# Patient Record
Sex: Male | Born: 1960 | Race: Black or African American | Hispanic: No | Marital: Married | Smoking: Never smoker
Health system: Southern US, Community
[De-identification: ages and names within clinical notes are randomized; demographics above are authoritative.]

## PROBLEM LIST (undated history)

## (undated) DIAGNOSIS — S83249A Other tear of medial meniscus, current injury, unspecified knee, initial encounter: Secondary | ICD-10-CM

## (undated) DIAGNOSIS — I10 Essential (primary) hypertension: Secondary | ICD-10-CM

## (undated) HISTORY — PX: NO PAST SURGERIES: SHX2092

---

## 1998-12-16 ENCOUNTER — Emergency Department (HOSPITAL_COMMUNITY): Admission: EM | Admit: 1998-12-16 | Discharge: 1998-12-16 | Payer: Self-pay | Admitting: Emergency Medicine

## 1998-12-23 ENCOUNTER — Emergency Department (HOSPITAL_COMMUNITY): Admission: EM | Admit: 1998-12-23 | Discharge: 1998-12-23 | Payer: Self-pay | Admitting: Emergency Medicine

## 2001-06-29 ENCOUNTER — Emergency Department (HOSPITAL_COMMUNITY): Admission: EM | Admit: 2001-06-29 | Discharge: 2001-06-29 | Payer: Self-pay

## 2016-09-30 ENCOUNTER — Other Ambulatory Visit: Payer: Self-pay | Admitting: Occupational Medicine

## 2016-09-30 ENCOUNTER — Ambulatory Visit: Payer: Self-pay

## 2016-09-30 DIAGNOSIS — M25562 Pain in left knee: Secondary | ICD-10-CM

## 2016-10-30 ENCOUNTER — Encounter (HOSPITAL_BASED_OUTPATIENT_CLINIC_OR_DEPARTMENT_OTHER)
Admission: RE | Admit: 2016-10-30 | Discharge: 2016-10-30 | Disposition: A | Discharge status: Home / Self Care | Payer: BLUE CROSS/BLUE SHIELD | Source: Ambulatory Visit | Attending: Orthopedic Surgery | Admitting: Orthopedic Surgery

## 2016-10-30 ENCOUNTER — Encounter (HOSPITAL_BASED_OUTPATIENT_CLINIC_OR_DEPARTMENT_OTHER): Payer: Self-pay | Admitting: *Deleted

## 2016-10-30 DIAGNOSIS — I10 Essential (primary) hypertension: Secondary | ICD-10-CM | POA: Insufficient documentation

## 2016-10-30 DIAGNOSIS — M94262 Chondromalacia, left knee: Secondary | ICD-10-CM | POA: Diagnosis not present

## 2016-10-30 DIAGNOSIS — S83282A Other tear of lateral meniscus, current injury, left knee, initial encounter: Secondary | ICD-10-CM | POA: Diagnosis not present

## 2016-10-30 DIAGNOSIS — S83242A Other tear of medial meniscus, current injury, left knee, initial encounter: Secondary | ICD-10-CM | POA: Diagnosis not present

## 2016-10-31 ENCOUNTER — Ambulatory Visit: Payer: Self-pay | Admitting: Physician Assistant

## 2016-10-31 NOTE — H&P (Signed)
Roberto BillJulian R Torres is an 56 y.o. male.   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  He initially led with his uninvolved right knee, but then he twisted and felt a pop in his left knee.  On 09/27/2016 he went to an urgent care setting where x-rays were basically unremarkable.  They showed some soft tissue swelling, slight arthritic changes and spurring along the patella.  He has severe pain.  He went in to do some light duty and was sent home after several days.  MRI confirms torn medial meniscus.  Past Medical History:  Diagnosis Date  . Hypertension   . MMT (medial meniscus tear)    left knee    Past Surgical History:  Procedure Laterality Date  . NO PAST SURGERIES      No family history on file. Social History:  reports that he has never smoked. He has never used smokeless tobacco. He reports that he drinks alcohol. He reports that he does not use drugs.  Allergies: No Known Allergies   (Not in a hospital admission)  No results found for this or any previous visit (from the past 48 hour(s)). No results found.  Review of Systems  Musculoskeletal: Positive for joint pain.  All other systems reviewed and are negative.   There were no vitals taken for this visit. Physical Exam  Constitutional: He is oriented to person, place, and time. He appears well-developed and well-nourished. No distress.  HENT:  Head: Normocephalic and atraumatic.  Eyes: Conjunctivae and EOM are normal. Pupils are equal, round, and reactive to light.  Neck: Normal range of motion.  Cardiovascular: Normal rate and intact distal pulses.   Respiratory: Effort normal. No respiratory distress.  GI: Soft. There is no tenderness.  Musculoskeletal:       Left knee: He exhibits swelling. Tenderness found. Medial joint line tenderness noted.  Neurological: He is alert and oriented to person, place, and time.  Skin: Skin is warm  and dry.  Psychiatric: He has a normal mood and affect.     Assessment/Plan Left knee medial meniscus tear chondromalacia    I recommend arthroscopic evaluation, partial medial meniscectomy of the left knee with debridement and chondroplasty.  He will need post-op rehab to be pre-authorized.  I would like to do the surgery on an outpatient basis as soon as practical since he is out of work.  We will have him start therapy the week after.  Risks and benefits are discussed in detail.  We will go ahead and give him Percocet.    Margart SicklesChadwell, Naydeen Speirs, PA-C 10/31/2016, 12:59 PM

## 2016-11-01 ENCOUNTER — Ambulatory Visit (HOSPITAL_BASED_OUTPATIENT_CLINIC_OR_DEPARTMENT_OTHER): Payer: Worker's Compensation | Admitting: Anesthesiology

## 2016-11-01 ENCOUNTER — Encounter (HOSPITAL_BASED_OUTPATIENT_CLINIC_OR_DEPARTMENT_OTHER): Payer: Self-pay

## 2016-11-01 ENCOUNTER — Ambulatory Visit (HOSPITAL_BASED_OUTPATIENT_CLINIC_OR_DEPARTMENT_OTHER)
Admission: RE | Admit: 2016-11-01 | Discharge: 2016-11-01 | Disposition: A | Discharge status: Home / Self Care | Payer: Worker's Compensation | Source: Ambulatory Visit | Attending: Orthopedic Surgery | Admitting: Orthopedic Surgery

## 2016-11-01 ENCOUNTER — Encounter (HOSPITAL_BASED_OUTPATIENT_CLINIC_OR_DEPARTMENT_OTHER)
Admission: RE | Disposition: A | Discharge status: Home / Self Care | Payer: Self-pay | Source: Ambulatory Visit | Attending: Orthopedic Surgery

## 2016-11-01 DIAGNOSIS — S83282A Other tear of lateral meniscus, current injury, left knee, initial encounter: Secondary | ICD-10-CM | POA: Diagnosis not present

## 2016-11-01 DIAGNOSIS — E669 Obesity, unspecified: Secondary | ICD-10-CM | POA: Diagnosis not present

## 2016-11-01 DIAGNOSIS — Y9301 Activity, walking, marching and hiking: Secondary | ICD-10-CM | POA: Insufficient documentation

## 2016-11-01 DIAGNOSIS — W1809XA Striking against other object with subsequent fall, initial encounter: Secondary | ICD-10-CM | POA: Insufficient documentation

## 2016-11-01 DIAGNOSIS — S83242A Other tear of medial meniscus, current injury, left knee, initial encounter: Secondary | ICD-10-CM | POA: Diagnosis present

## 2016-11-01 DIAGNOSIS — Y990 Civilian activity done for income or pay: Secondary | ICD-10-CM | POA: Insufficient documentation

## 2016-11-01 DIAGNOSIS — Z6833 Body mass index (BMI) 33.0-33.9, adult: Secondary | ICD-10-CM | POA: Diagnosis not present

## 2016-11-01 DIAGNOSIS — M94262 Chondromalacia, left knee: Secondary | ICD-10-CM | POA: Insufficient documentation

## 2016-11-01 DIAGNOSIS — I10 Essential (primary) hypertension: Secondary | ICD-10-CM | POA: Diagnosis not present

## 2016-11-01 HISTORY — DX: Essential (primary) hypertension: I10

## 2016-11-01 HISTORY — PX: KNEE ARTHROSCOPY WITH MEDIAL MENISECTOMY: SHX5651

## 2016-11-01 HISTORY — PX: KNEE ARTHROSCOPY WITH LATERAL MENISECTOMY: SHX6193

## 2016-11-01 HISTORY — PX: KNEE ARTHROSCOPY WITH EXCISION PLICA: SHX5647

## 2016-11-01 HISTORY — DX: Other tear of medial meniscus, current injury, unspecified knee, initial encounter: S83.249A

## 2016-11-01 SURGERY — ARTHROSCOPY, KNEE, WITH MEDIAL MENISCECTOMY
Anesthesia: General | Site: Knee | Laterality: Left

## 2016-11-01 MED ORDER — KETOROLAC TROMETHAMINE 30 MG/ML IJ SOLN
INTRAMUSCULAR | Status: DC | PRN
  Administered 2016-11-01: 30 mg via INTRAVENOUS

## 2016-11-01 MED ORDER — OXYCODONE-ACETAMINOPHEN 5-325 MG PO TABS: 1.0000 | ORAL_TABLET | ORAL | Status: DC | PRN

## 2016-11-01 MED ORDER — EPINEPHRINE 30 MG/30ML IJ SOLN
INTRAMUSCULAR | Status: AC
  Filled 2016-11-01: qty 1

## 2016-11-01 MED ORDER — PROMETHAZINE HCL 25 MG/ML IJ SOLN
6.2500 mg | INTRAMUSCULAR | Status: DC | PRN
  Administered 2016-11-01: 6.25 mg via INTRAVENOUS

## 2016-11-01 MED ORDER — KETOROLAC TROMETHAMINE 30 MG/ML IJ SOLN
INTRAMUSCULAR | Status: AC
  Filled 2016-11-01: qty 1

## 2016-11-01 MED ORDER — PROPOFOL 10 MG/ML IV BOLUS
INTRAVENOUS | Status: DC | PRN
  Administered 2016-11-01: 200 mg via INTRAVENOUS

## 2016-11-01 MED ORDER — MEPERIDINE HCL 25 MG/ML IJ SOLN: 6.2500 mg | INTRAMUSCULAR | Status: DC | PRN

## 2016-11-01 MED ORDER — SODIUM CHLORIDE 0.9 % IR SOLN
Status: DC | PRN
  Administered 2016-11-01: 4500 mL

## 2016-11-01 MED ORDER — SCOPOLAMINE 1 MG/3DAYS TD PT72
MEDICATED_PATCH | TRANSDERMAL | Status: AC
  Filled 2016-11-01: qty 1

## 2016-11-01 MED ORDER — MIDAZOLAM HCL 2 MG/2ML IJ SOLN
INTRAMUSCULAR | Status: AC
  Filled 2016-11-01: qty 2

## 2016-11-01 MED ORDER — FENTANYL CITRATE (PF) 100 MCG/2ML IJ SOLN
50.0000 ug | INTRAMUSCULAR | Status: DC | PRN
  Administered 2016-11-01: 100 ug via INTRAVENOUS
  Administered 2016-11-01: 50 ug via INTRAVENOUS

## 2016-11-01 MED ORDER — BUPIVACAINE-EPINEPHRINE 0.5% -1:200000 IJ SOLN
INTRAMUSCULAR | Status: DC | PRN
  Administered 2016-11-01: 20 mL

## 2016-11-01 MED ORDER — METOCLOPRAMIDE HCL 5 MG PO TABS: 5.0000 mg | ORAL_TABLET | Freq: Three times a day (TID) | ORAL | Status: DC | PRN

## 2016-11-01 MED ORDER — CHLORHEXIDINE GLUCONATE 4 % EX LIQD: 60.0000 mL | Freq: Once | CUTANEOUS | Status: DC

## 2016-11-01 MED ORDER — CEFAZOLIN SODIUM-DEXTROSE 2-4 GM/100ML-% IV SOLN
2.0000 g | INTRAVENOUS | Status: AC
  Administered 2016-11-01: 2 g via INTRAVENOUS

## 2016-11-01 MED ORDER — ONDANSETRON HCL 4 MG/2ML IJ SOLN
INTRAMUSCULAR | Status: DC | PRN
  Administered 2016-11-01: 4 mg via INTRAVENOUS

## 2016-11-01 MED ORDER — METHYLPREDNISOLONE ACETATE 80 MG/ML IJ SUSP
INTRAMUSCULAR | Status: AC
  Filled 2016-11-01: qty 1

## 2016-11-01 MED ORDER — LIDOCAINE 2% (20 MG/ML) 5 ML SYRINGE
INTRAMUSCULAR | Status: AC
Start: 2016-11-01 — End: 2016-11-01
  Filled 2016-11-01: qty 5

## 2016-11-01 MED ORDER — ONDANSETRON HCL 4 MG/2ML IJ SOLN: 4.0000 mg | Freq: Four times a day (QID) | INTRAMUSCULAR | Status: DC | PRN

## 2016-11-01 MED ORDER — MORPHINE SULFATE (PF) 4 MG/ML IV SOLN
INTRAVENOUS | Status: AC
Start: 2016-11-01 — End: 2016-11-01
  Filled 2016-11-01: qty 1

## 2016-11-01 MED ORDER — DEXAMETHASONE SODIUM PHOSPHATE 10 MG/ML IJ SOLN
INTRAMUSCULAR | Status: AC
  Filled 2016-11-01: qty 1

## 2016-11-01 MED ORDER — HYDROMORPHONE HCL 1 MG/ML IJ SOLN
INTRAMUSCULAR | Status: AC
  Filled 2016-11-01: qty 0.5

## 2016-11-01 MED ORDER — LIDOCAINE 2% (20 MG/ML) 5 ML SYRINGE
INTRAMUSCULAR | Status: DC | PRN
  Administered 2016-11-01: 40 mg via INTRAVENOUS

## 2016-11-01 MED ORDER — METOCLOPRAMIDE HCL 5 MG/ML IJ SOLN: 5.0000 mg | Freq: Three times a day (TID) | INTRAMUSCULAR | Status: DC | PRN

## 2016-11-01 MED ORDER — SODIUM CHLORIDE 0.9 % IV SOLN: INTRAVENOUS | Status: DC

## 2016-11-01 MED ORDER — HYDROMORPHONE HCL 1 MG/ML IJ SOLN
0.2500 mg | INTRAMUSCULAR | Status: DC | PRN
  Administered 2016-11-01: 0.5 mg via INTRAVENOUS

## 2016-11-01 MED ORDER — HYDROCODONE-ACETAMINOPHEN 7.5-325 MG PO TABS: 1.0000 | ORAL_TABLET | Freq: Once | ORAL | Status: DC | PRN

## 2016-11-01 MED ORDER — BUPIVACAINE-EPINEPHRINE (PF) 0.5% -1:200000 IJ SOLN
INTRAMUSCULAR | Status: AC
  Filled 2016-11-01: qty 30

## 2016-11-01 MED ORDER — METHYLPREDNISOLONE ACETATE 40 MG/ML IJ SUSP
INTRAMUSCULAR | Status: AC
  Filled 2016-11-01: qty 1

## 2016-11-01 MED ORDER — CEFAZOLIN SODIUM-DEXTROSE 2-4 GM/100ML-% IV SOLN
INTRAVENOUS | Status: AC
  Filled 2016-11-01: qty 100

## 2016-11-01 MED ORDER — DEXAMETHASONE SODIUM PHOSPHATE 4 MG/ML IJ SOLN
INTRAMUSCULAR | Status: DC | PRN
  Administered 2016-11-01: 10 mg via INTRAVENOUS

## 2016-11-01 MED ORDER — METHYLPREDNISOLONE ACETATE 80 MG/ML IJ SUSP
INTRAMUSCULAR | Status: DC | PRN
  Administered 2016-11-01: 80 mg via INTRA_ARTICULAR

## 2016-11-01 MED ORDER — SCOPOLAMINE 1 MG/3DAYS TD PT72: 1.0000 | MEDICATED_PATCH | Freq: Once | TRANSDERMAL | Status: DC | PRN

## 2016-11-01 MED ORDER — ONDANSETRON HCL 4 MG PO TABS: 4.0000 mg | ORAL_TABLET | Freq: Four times a day (QID) | ORAL | Status: DC | PRN

## 2016-11-01 MED ORDER — ONDANSETRON HCL 4 MG/2ML IJ SOLN
INTRAMUSCULAR | Status: AC
  Filled 2016-11-01: qty 2

## 2016-11-01 MED ORDER — LACTATED RINGERS IV SOLN
INTRAVENOUS | Status: DC
  Administered 2016-11-01: 12:00:00 via INTRAVENOUS

## 2016-11-01 MED ORDER — FENTANYL CITRATE (PF) 100 MCG/2ML IJ SOLN
INTRAMUSCULAR | Status: AC
  Filled 2016-11-01: qty 2

## 2016-11-01 MED ORDER — PROMETHAZINE HCL 25 MG/ML IJ SOLN
INTRAMUSCULAR | Status: AC
  Filled 2016-11-01: qty 1

## 2016-11-01 MED ORDER — MORPHINE SULFATE (PF) 4 MG/ML IV SOLN
INTRAVENOUS | Status: DC | PRN
  Administered 2016-11-01: 4 mg

## 2016-11-01 MED ORDER — HYDROMORPHONE HCL 1 MG/ML IJ SOLN: 0.5000 mg | INTRAMUSCULAR | Status: DC | PRN

## 2016-11-01 MED ORDER — MIDAZOLAM HCL 2 MG/2ML IJ SOLN
1.0000 mg | INTRAMUSCULAR | Status: DC | PRN
  Administered 2016-11-01: 2 mg via INTRAVENOUS

## 2016-11-01 MED ORDER — BUPIVACAINE HCL (PF) 0.5 % IJ SOLN
INTRAMUSCULAR | Status: AC
  Filled 2016-11-01: qty 30

## 2016-11-01 SURGICAL SUPPLY — 39 items
BANDAGE ACE 6X5 VEL STRL LF (GAUZE/BANDAGES/DRESSINGS) IMPLANT
BANDAGE ESMARK 6X9 LF (GAUZE/BANDAGES/DRESSINGS) IMPLANT
BLADE 4.2CUDA (BLADE) ×3 IMPLANT
BLADE CUDA GRT WHITE 3.5 (BLADE) ×3 IMPLANT
BNDG ESMARK 6X9 LF (GAUZE/BANDAGES/DRESSINGS)
BNDG GAUZE ELAST 4 BULKY (GAUZE/BANDAGES/DRESSINGS) ×3 IMPLANT
DRAPE ARTHROSCOPY W/POUCH 90 (DRAPES) ×3 IMPLANT
DRSG EMULSION OIL 3X3 NADH (GAUZE/BANDAGES/DRESSINGS) ×3 IMPLANT
DURAPREP 26ML APPLICATOR (WOUND CARE) ×3 IMPLANT
GAUZE SPONGE 4X4 12PLY STRL (GAUZE/BANDAGES/DRESSINGS) ×3 IMPLANT
GLOVE BIO SURGEON STRL SZ7.5 (GLOVE) IMPLANT
GLOVE BIOGEL PI IND STRL 7.0 (GLOVE) ×1 IMPLANT
GLOVE BIOGEL PI IND STRL 8 (GLOVE) ×1 IMPLANT
GLOVE BIOGEL PI INDICATOR 7.0 (GLOVE) ×2
GLOVE BIOGEL PI INDICATOR 8 (GLOVE) ×2
GLOVE ECLIPSE 6.5 STRL STRAW (GLOVE) ×6 IMPLANT
GLOVE SURG ORTHO 8.0 STRL STRW (GLOVE) ×3 IMPLANT
GOWN STRL REUS W/ TWL LRG LVL3 (GOWN DISPOSABLE) ×1 IMPLANT
GOWN STRL REUS W/ TWL XL LVL3 (GOWN DISPOSABLE) IMPLANT
GOWN STRL REUS W/TWL LRG LVL3 (GOWN DISPOSABLE) ×2
GOWN STRL REUS W/TWL XL LVL3 (GOWN DISPOSABLE) ×3 IMPLANT
HOLDER KNEE FOAM BLUE (MISCELLANEOUS) ×3 IMPLANT
IV NS IRRIG 3000ML ARTHROMATIC (IV SOLUTION) ×6 IMPLANT
KNEE WRAP E Z 3 GEL PACK (MISCELLANEOUS) ×3 IMPLANT
MANIFOLD NEPTUNE II (INSTRUMENTS) ×3 IMPLANT
NDL SAFETY ECLIPSE 18X1.5 (NEEDLE) IMPLANT
NEEDLE HYPO 18GX1.5 SHARP (NEEDLE)
NEEDLE HYPO 22GX1.5 SAFETY (NEEDLE) ×3 IMPLANT
PACK ARTHROSCOPY DSU (CUSTOM PROCEDURE TRAY) ×3 IMPLANT
PACK BASIN DAY SURGERY FS (CUSTOM PROCEDURE TRAY) ×3 IMPLANT
PROBE BIPOLAR ARTHRO 85MM 30D (MISCELLANEOUS) IMPLANT
PROBE BIPOLAR ATHRO 135MM 90D (MISCELLANEOUS) IMPLANT
SET ARTHROSCOPY TUBING (MISCELLANEOUS) ×2
SET ARTHROSCOPY TUBING LN (MISCELLANEOUS) ×1 IMPLANT
SUT ETHILON 4 0 PS 2 18 (SUTURE) ×3 IMPLANT
SYR 5ML LL (SYRINGE) ×6 IMPLANT
TOWEL OR 17X24 6PK STRL BLUE (TOWEL DISPOSABLE) ×3 IMPLANT
TOWEL OR NON WOVEN STRL DISP B (DISPOSABLE) ×3 IMPLANT
WATER STERILE IRR 1000ML POUR (IV SOLUTION) ×3 IMPLANT

## 2016-11-01 NOTE — Transfer of Care (Signed)
Immediate Anesthesia Transfer of Care Note  Patient: Jennette BillJulian R Grove  Procedure(s) Performed: Procedure(s): LEFT KNEE ARTHROSCOPY WITH MEDIAL MENISECTOMY (Left) KNEE ARTHROSCOPY WITH LATERAL MENISECTOMY (Left) KNEE ARTHROSCOPY WITH EXCISION PLICA (Left)  Patient Location: PACU  Anesthesia Type:General  Level of Consciousness: awake and sedated  Airway & Oxygen Therapy: Patient Spontanous Breathing and Patient connected to face mask oxygen  Post-op Assessment: Report given to RN and Post -op Vital signs reviewed and stable  Post vital signs: Reviewed and stable  Last Vitals:  Vitals:   11/01/16 1128 11/01/16 1415  BP: 127/87   Pulse: 84 88  Resp: 18 15  Temp: 36.9 C     Last Pain:  Vitals:   11/01/16 1128  TempSrc: Oral  PainSc: 9       Patients Stated Pain Goal: 3 (11/01/16 1128)  Complications: No apparent anesthesia complications

## 2016-11-01 NOTE — Interval H&P Note (Signed)
History and Physical Interval Note:  11/01/2016 1:00 PM  Roberto BillJulian R Zirbel  has presented today for surgery, with the diagnosis of LEFT KNEE MEDIAL MINSCUS TEAR  The various methods of treatment have been discussed with the patient and family. After consideration of risks, benefits and other options for treatment, the patient has consented to  Procedure(s): LEFT KNEE ARTHROSCOPY WITH MEDIAL MENISECTOMY (Left) as a surgical intervention .  The patient's history has been reviewed, patient examined, no change in status, stable for surgery.  I have reviewed the patient's chart and labs.  Questions were answered to the patient's satisfaction.     Tanuj Mullens JR,W D

## 2016-11-01 NOTE — Anesthesia Procedure Notes (Signed)
Procedure Name: LMA Insertion Performed by: Zairah Arista W Pre-anesthesia Checklist: Patient identified, Emergency Drugs available, Suction available and Patient being monitored Patient Re-evaluated:Patient Re-evaluated prior to inductionOxygen Delivery Method: Circle system utilized Preoxygenation: Pre-oxygenation with 100% oxygen Intubation Type: IV induction Ventilation: Mask ventilation without difficulty LMA: LMA inserted LMA Size: 5.0 Number of attempts: 1 Placement Confirmation: positive ETCO2 Tube secured with: Tape Dental Injury: Teeth and Oropharynx as per pre-operative assessment        

## 2016-11-01 NOTE — H&P (View-Only) (Signed)
Roberto Torres is an 56 y.o. male.   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  He initially led with his uninvolved right knee, but then he twisted and felt a pop in his left knee.  On 09/27/2016 he went to an urgent care setting where x-rays were basically unremarkable.  They showed some soft tissue swelling, slight arthritic changes and spurring along the patella.  He has severe pain.  He went in to do some light duty and was sent home after several days.  MRI confirms torn medial meniscus.  Past Medical History:  Diagnosis Date  . Hypertension   . MMT (medial meniscus tear)    left knee    Past Surgical History:  Procedure Laterality Date  . NO PAST SURGERIES      No family history on file. Social History:  reports that he has never smoked. He has never used smokeless tobacco. He reports that he drinks alcohol. He reports that he does not use drugs.  Allergies: No Known Allergies   (Not in a hospital admission)  No results found for this or any previous visit (from the past 48 hour(s)). No results found.  Review of Systems  Musculoskeletal: Positive for joint pain.  All other systems reviewed and are negative.   There were no vitals taken for this visit. Physical Exam  Constitutional: He is oriented to person, place, and time. He appears well-developed and well-nourished. No distress.  HENT:  Head: Normocephalic and atraumatic.  Eyes: Conjunctivae and EOM are normal. Pupils are equal, round, and reactive to light.  Neck: Normal range of motion.  Cardiovascular: Normal rate and intact distal pulses.   Respiratory: Effort normal. No respiratory distress.  GI: Soft. There is no tenderness.  Musculoskeletal:       Left knee: He exhibits swelling. Tenderness found. Medial joint line tenderness noted.  Neurological: He is alert and oriented to person, place, and time.  Skin: Skin is warm  and dry.  Psychiatric: He has a normal mood and affect.     Assessment/Plan Left knee medial meniscus tear chondromalacia    I recommend arthroscopic evaluation, partial medial meniscectomy of the left knee with debridement and chondroplasty.  He will need post-op rehab to be pre-authorized.  I would like to do the surgery on an outpatient basis as soon as practical since he is out of work.  We will have him start therapy the week after.  Risks and benefits are discussed in detail.  We will go ahead and give him Percocet.    Margart SicklesChadwell, Mayrani Khamis, PA-C 10/31/2016, 12:59 PM

## 2016-11-01 NOTE — Anesthesia Postprocedure Evaluation (Signed)
Anesthesia Post Note  Patient: Roberto BillJulian R Vanderslice  Procedure(s) Performed: Procedure(s) (LRB): LEFT KNEE ARTHROSCOPY WITH MEDIAL MENISECTOMY (Left) KNEE ARTHROSCOPY WITH LATERAL MENISECTOMY (Left) KNEE ARTHROSCOPY WITH EXCISION PLICA (Left)     Patient location during evaluation: PACU Anesthesia Type: General Level of consciousness: awake and alert Pain management: pain level controlled Vital Signs Assessment: post-procedure vital signs reviewed and stable Respiratory status: spontaneous breathing, nonlabored ventilation and respiratory function stable Cardiovascular status: blood pressure returned to baseline and stable Postop Assessment: no signs of nausea or vomiting Anesthetic complications: no    Last Vitals:  Vitals:   11/01/16 1437 11/01/16 1445  BP:  (!) 141/93  Pulse: 81 78  Resp: 17 18  Temp:      Last Pain:  Vitals:   11/01/16 1450  TempSrc:   PainSc: Asleep    LLE Motor Response: Purposeful movement (11/01/16 1450) LLE Sensation: No pain (11/01/16 1450)          Erilyn Pearman A.

## 2016-11-01 NOTE — Discharge Instructions (Signed)
Discharge Instructions after Knee Arthroscopy   You will have a light dressing on your knee.  Leave the dressing in place until the third day after your surgery and then remove it and place a band-aid over the stitches.  After the bandage has been removed you may shower, but do not soak the incision. You may begin gentle motion of your leg immediately after surgery. Pump your foot up and down 20 times per hour, every hour you are awake.  Apply ice to the knee 3 times per day for 30 minutes for the first 1 week until your knee is feeling comfortable again. Do not use heat.  You may begin straight leg raising exercises (if you have a brace with it on). While lying down, pull your foot all the way up, tighten your quadriceps muscle and lift your heel off of the ground. Hold this position for 2 seconds, and then let the leg back down. Repeat the exercise 10 times, at least 3 times a day.  Pain medicine has been prescribed for you.  Use your medicine as needed over the first 48 hours, and then you can begin to taper your use. You may take Extra Strength Tylenol or Tylenol only in place of the pain pills.    Please call [phone] for any problems. Including the following:  - excessive redness of the incisions - drainage for more than 4 days - fever of more than 101.5 F  *Please note that pain medications will not be refilled after hours or on weekends.    Post Anesthesia Home Care Instructions  Activity: Get plenty of rest for the remainder of the day. A responsible individual must stay with you for 24 hours following the procedure.  For the next 24 hours, DO NOT: -Drive a car -Advertising copywriterperate machinery -Drink alcoholic beverages -Take any medication unless instructed by your physician -Make any legal decisions or sign important papers.  Meals: Start with liquid foods such as gelatin or soup. Progress to regular foods as tolerated. Avoid greasy, spicy, heavy foods. If nausea and/or vomiting  occur, drink only clear liquids until the nausea and/or vomiting subsides. Call your physician if vomiting continues.  Special Instructions/Symptoms: Your throat may feel dry or sore from the anesthesia or the breathing tube placed in your throat during surgery. If this causes discomfort, gargle with warm salt water. The discomfort should disappear within 24 hours.  If you had a scopolamine patch placed behind your ear for the management of post- operative nausea and/or vomiting:  1. The medication in the patch is effective for 72 hours, after which it should be removed.  Wrap patch in a tissue and discard in the trash. Wash hands thoroughly with soap and water. 2. You may remove the patch earlier than 72 hours if you experience unpleasant side effects which may include dry mouth, dizziness or visual disturbances. 3. Avoid touching the patch. Wash your hands with soap and water after contact with the patch.   Call your surgeon if you experience:   1.  Fever over 101.0. 2.  Inability to urinate. 3.  Nausea and/or vomiting. 4.  Extreme swelling or bruising at the surgical site. 5.  Continued bleeding from the incision. 6.  Increased pain, redness or drainage from the incision. 7.  Problems related to your pain medication. 8.  Any problems and/or concerns

## 2016-11-01 NOTE — Anesthesia Preprocedure Evaluation (Signed)
Anesthesia Evaluation  Patient identified by MRN, date of birth, ID band Patient awake    Reviewed: Allergy & Precautions, NPO status , Patient's Chart, lab work & pertinent test results  Airway Mallampati: II  TM Distance: >3 FB Neck ROM: Full    Dental no notable dental hx. (+) Teeth Intact   Pulmonary neg pulmonary ROS,    Pulmonary exam normal breath sounds clear to auscultation       Cardiovascular hypertension, Pt. on medications Normal cardiovascular exam Rhythm:Regular Rate:Normal     Neuro/Psych negative neurological ROS  negative psych ROS   GI/Hepatic negative GI ROS, Neg liver ROS,   Endo/Other  Obesity   Renal/GU negative Renal ROS  negative genitourinary   Musculoskeletal Medial meniscus tear left knee   Abdominal (+) + obese,   Peds  Hematology negative hematology ROS (+)   Anesthesia Other Findings   Reproductive/Obstetrics                             Anesthesia Physical Anesthesia Plan  ASA: II  Anesthesia Plan: General   Post-op Pain Management:    Induction: Intravenous  PONV Risk Score and Plan: 3 and Ondansetron, Dexamethasone, Propofol and Midazolam  Airway Management Planned: LMA  Additional Equipment:   Intra-op Plan:   Post-operative Plan: Extubation in OR  Informed Consent: I have reviewed the patients History and Physical, chart, labs and discussed the procedure including the risks, benefits and alternatives for the proposed anesthesia with the patient or authorized representative who has indicated his/her understanding and acceptance.   Dental advisory given  Plan Discussed with: CRNA, Anesthesiologist and Surgeon  Anesthesia Plan Comments:         Anesthesia Quick Evaluation

## 2016-11-04 NOTE — Op Note (Signed)
NAME:  Roberto Torres, Roberto Torres                 ACCOUNT NO.:  MEDICAL RECORD NO.:  [id number]  LOCATION:                                 FACILITY:  PHYSICIAN:  Dyke BrackettW. D. Teri Diltz, M.D.         DATE OF BIRTH:  DATE OF PROCEDURE: DATE OF DISCHARGE:                              OPERATIVE REPORT   INDICATIONS:  Work-related injury, left knee with resultant meniscal tear, thought to be amenable to outpatient surgery.  PREOPERATIVE DIAGNOSIS:  Torn medial meniscus.  POSTOPERATIVE DIAGNOSES: 1. Torn medial meniscus. 2. Torn lateral meniscus. 3. Chondral damage to the patellofemoral joint medial compartment.  PROCEDURES PERFORMED: 1. Partial, medial, and lateral meniscectomies. 2. Debridement chondroplasty, patellofemoral joint, medial     compartment.  SURGEON:  Dyke BrackettW. D. Mairlyn Tegtmeyer, M.D..  ANESTHESIA:  General anesthetic with local supplementation.  DESCRIPTION OF PROCEDURE:  Supine position in leg holder.  No tourniquet.  Inferomedial, inferolateral portals created.  Systematic inspection of the knee showed the patient to have grade 2 chondromalacia of the patella.  Grade 2 advanced to grade 3 trochlear groove changes of the trochlear groove which were debrided.  Lateral compartment showed a small fraying type tear at the junction of the anterior middle horn debrided.  Articular surfaces and lateral compartment normal.  ACL, PCL normal.  Medial compartment showed a rather large radial tear of the meniscus at the junction of the middle posterior third.  This required resection of most of the posterior horn estimated about a 40% meniscectomy.  There was probably a 3 x 3 cm area of chondral damage on the medial side of the knee on the femur with less severe changes on the tibia, but nothing approaching a grade 4 lesion.  Again, chondroplasty was carried out in this area as well as partial medial meniscectomy. Portals were closed with nylon. Infiltrated the joint with a combination of Marcaine 0.5%  with epinephrine, 4 mg of morphine, 80 mg of Depo-Medrol with subcutaneous injection of Marcaine as well for a total of 20 mL 0.5% Marcaine. Lightly compressive sterile dressing applied, taken to recovery room in stable condition.     Dyke BrackettW. D. Itzelle Gains, M.D.     WDC/MEDQ  D:  11/01/2016  T:  11/01/2016  Job:  [id number]

## 2018-09-12 IMAGING — CR DG KNEE COMPLETE 4+V*L*
5 series · 5 of 5 positions shown · non-contrast
Comparison: None.

CLINICAL DATA: Pain following recent fall

EXAM:
LEFT KNEE - COMPLETE 4+ VIEW

[view not recorded (1 of 5)]
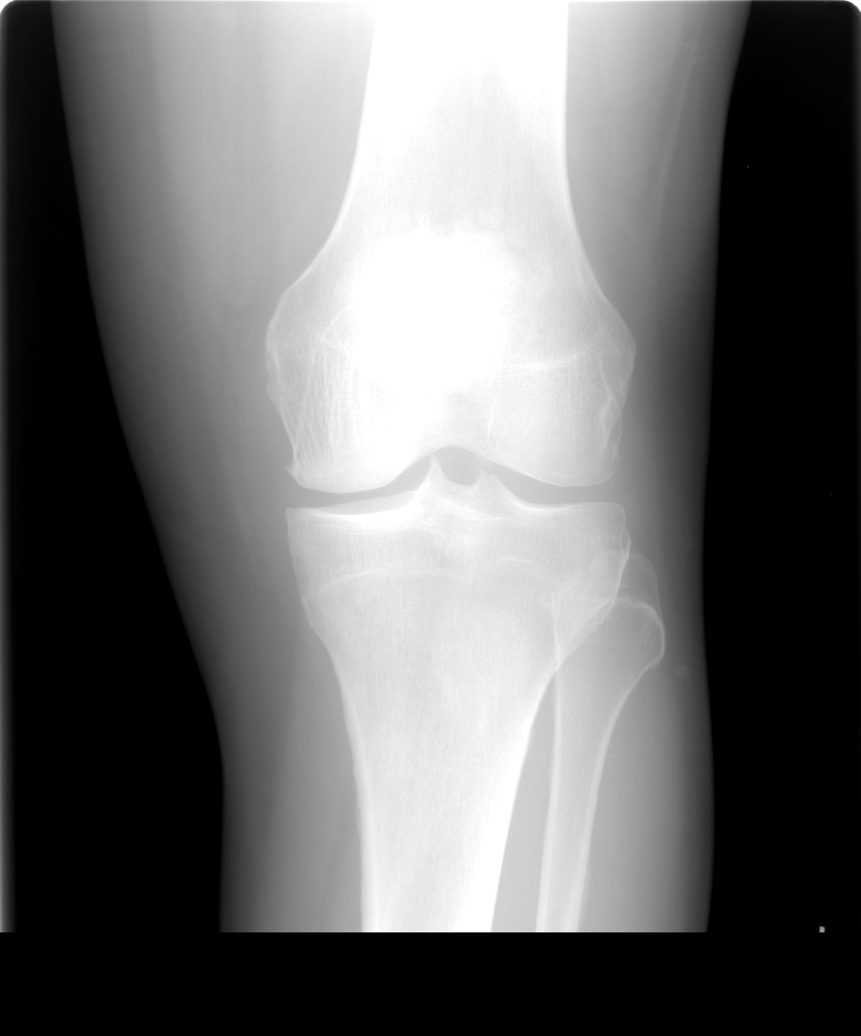

[view not recorded (2 of 5)]
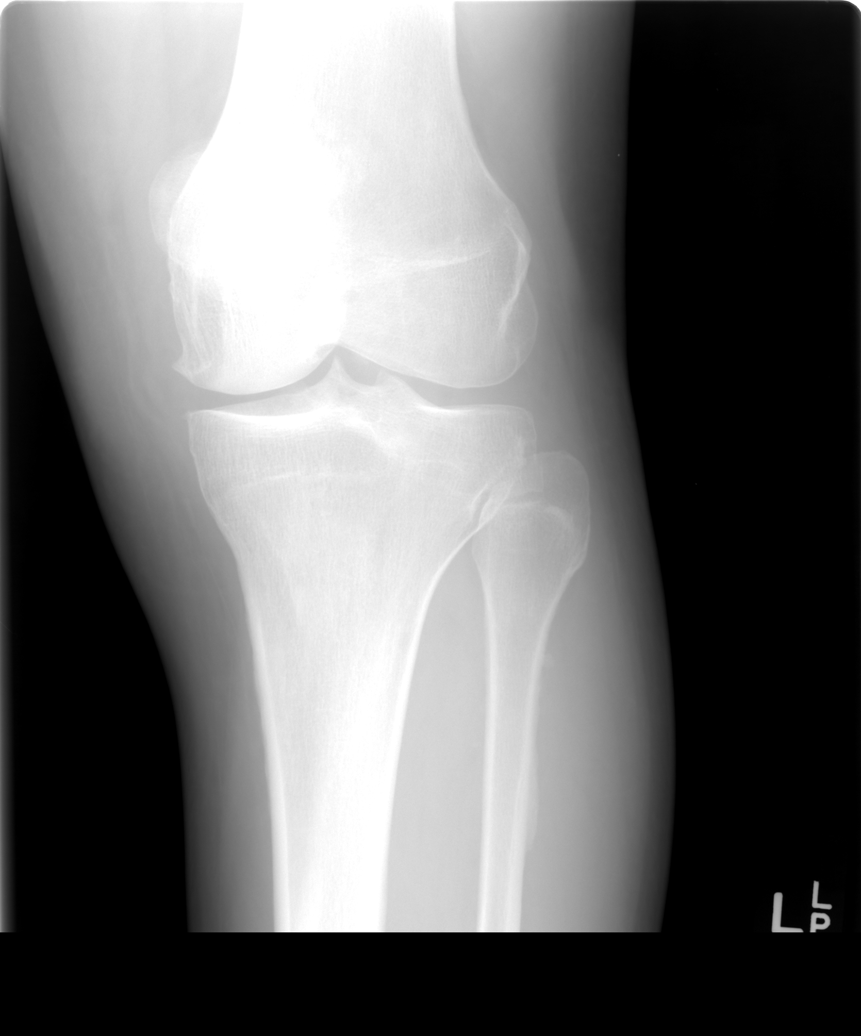

[view not recorded (3 of 5)]
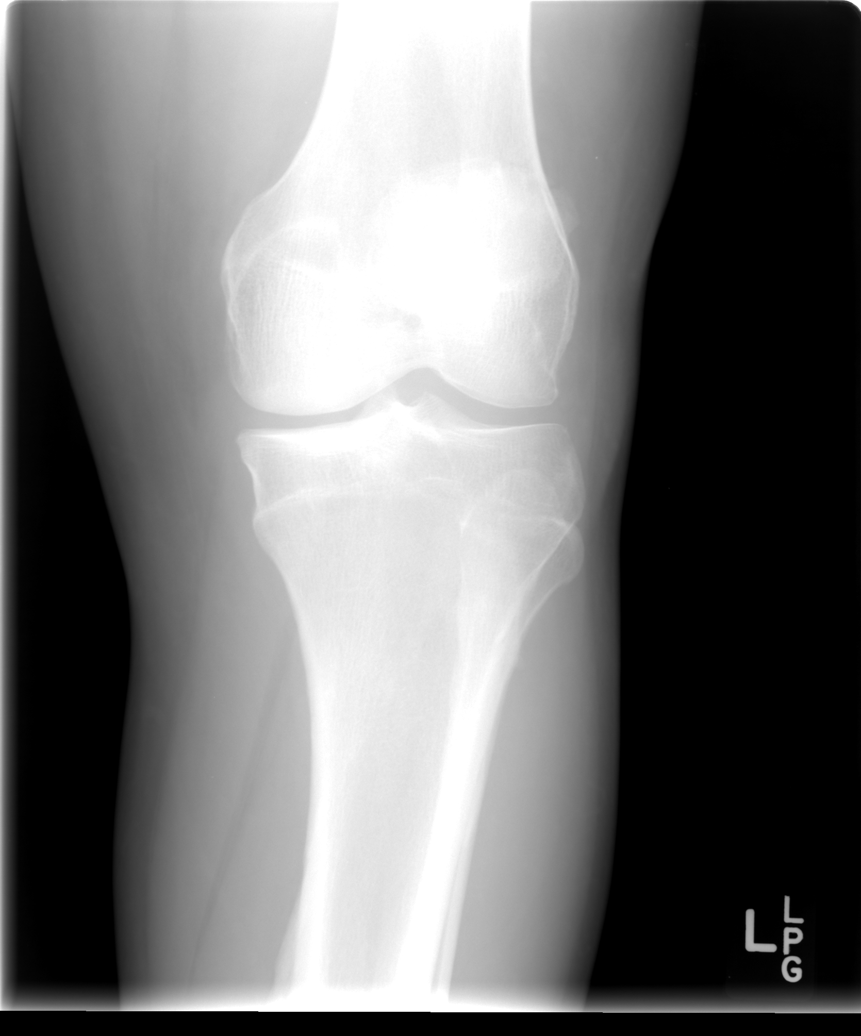

[view not recorded (4 of 5)]
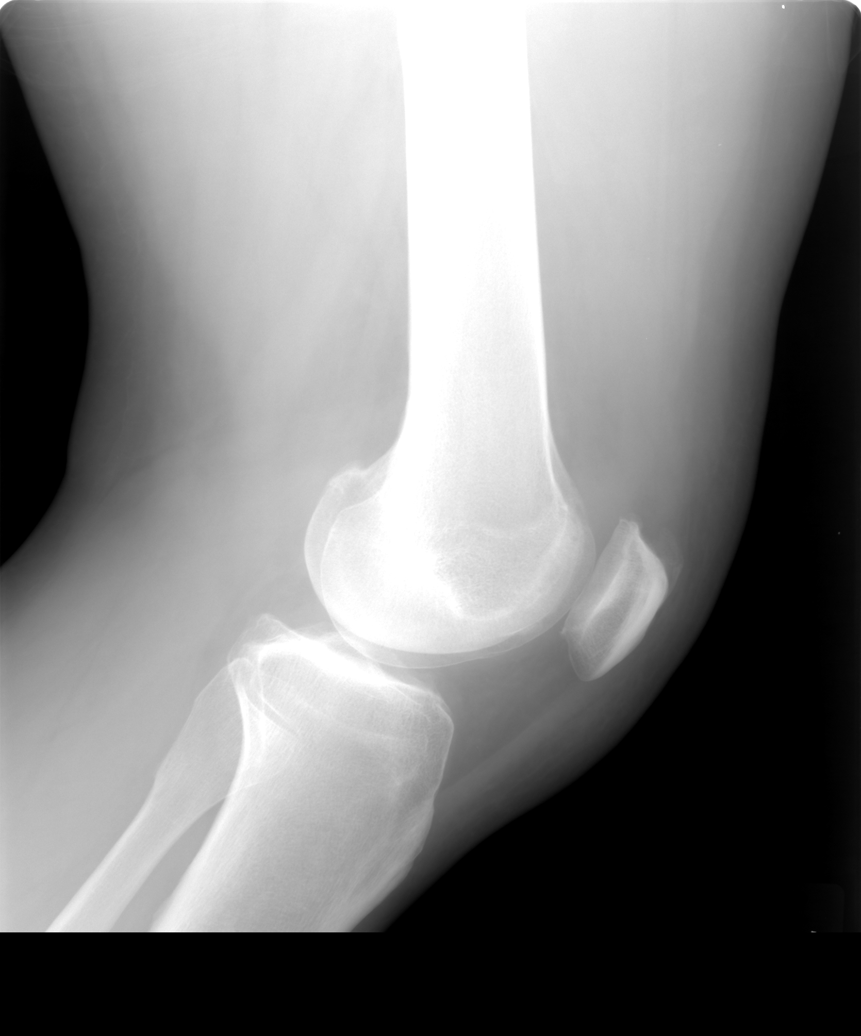

[view not recorded (5 of 5)]
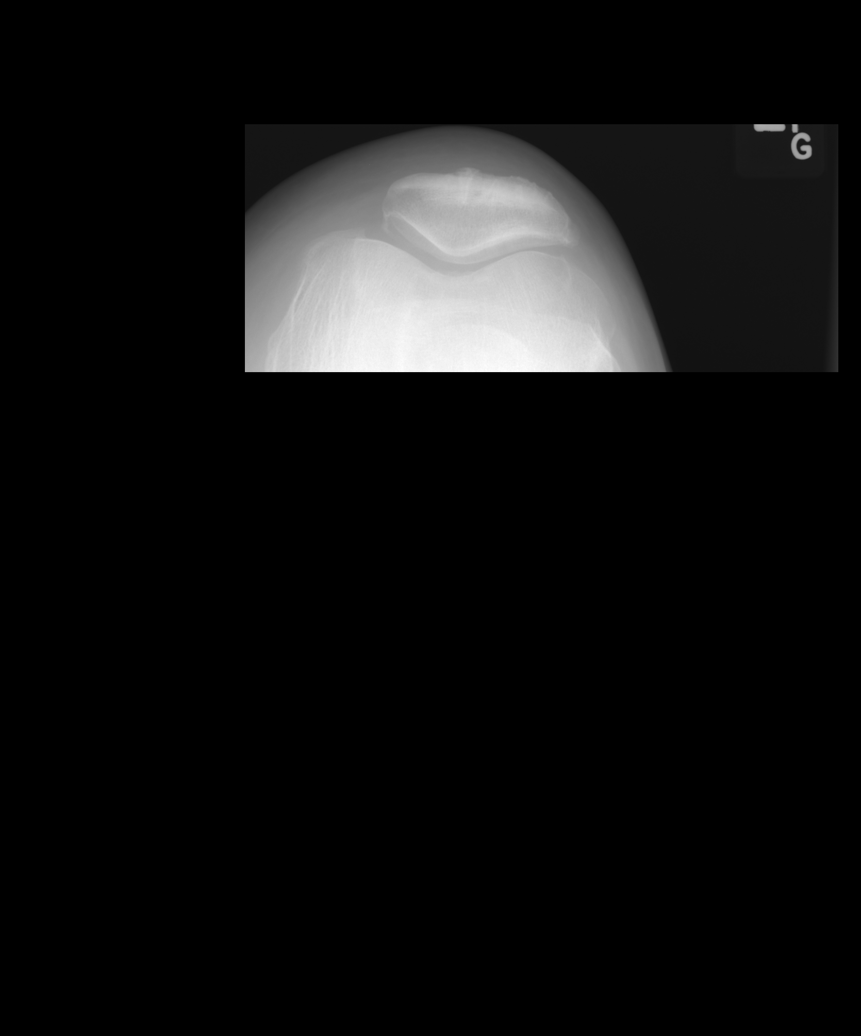

[5 of 5 positions shown; findings below may reference images not displayed]

FINDINGS: Frontal, lateral, bilateral oblique, and sunrise patellar images
were obtained. There is soft tissue swelling. There is no fracture
or dislocation. No evident joint effusion. There is slight narrowing
medially and in the patellofemoral joint regions. There is a spur
along the anterior superior patella. No erosive change.
IMPRESSION: Soft tissue swelling with slight osteoarthritic change. No fracture
or joint effusion. Spurring along the anterior superior patella most
likely is due to distal quadriceps tendinosis.

## 2020-04-10 ENCOUNTER — Other Ambulatory Visit: Payer: Self-pay

## 2020-04-10 ENCOUNTER — Ambulatory Visit (INDEPENDENT_AMBULATORY_CARE_PROVIDER_SITE_OTHER): Payer: BC Managed Care – PPO | Admitting: Podiatry

## 2020-04-10 ENCOUNTER — Ambulatory Visit (INDEPENDENT_AMBULATORY_CARE_PROVIDER_SITE_OTHER): Payer: BC Managed Care – PPO

## 2020-04-10 DIAGNOSIS — M7661 Achilles tendinitis, right leg: Secondary | ICD-10-CM

## 2020-04-10 MED ORDER — DICLOFENAC SODIUM 75 MG PO TBEC: 75.0000 mg | DELAYED_RELEASE_TABLET | Freq: Two times a day (BID) | ORAL | 2 refills | Status: AC

## 2020-04-10 MED ORDER — NITROGLYCERIN 0.1 MG/HR TD PT24: 0.2000 mg | MEDICATED_PATCH | Freq: Every day | TRANSDERMAL | Status: AC

## 2020-04-11 NOTE — Progress Notes (Signed)
Subjective:   Patient ID: Roberto Torres, male   DOB: 59 y.o.   MRN: [id number]   HPI Patient states his Achilles tendon right has been sore and that he strained it several months ago and has been trying to be more active.  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  This is been going on now for about 6 months and patient does not smoke likes to be active   Review of Systems  All other systems reviewed and are negative.       Objective:  Physical Exam Vitals and nursing note reviewed.  Constitutional:      Appearance: He is well-developed.  Pulmonary:     Effort: Pulmonary effort is normal.  Musculoskeletal:        General: Normal range of motion.  Skin:    General: Skin is warm.  Neurological:     Mental Status: He is alert.     Neurovascular status intact muscle strength found to be adequate range of motion within normal limits.  Patient is found to have pain of the Achilles tendon right at the muscle tendon junction with the insertion doing fine.  Patient is found to have moderate equinus and no other pathology noted with good digital perfusion and patient being well oriented      Assessment:  Strained Achilles tendon right painful at muscle tendon junction with no indications of tear     Plan:  H&P x-ray reviewed condition discussed.  I recommended immobilization with Cam walker which was dispensed today along with ice therapy and gradual stretching exercises along with nitro patches which were prescribed.  I then went ahead and I discussed long-term orthotics with heel lift type therapy which will be discussed at next visit  X-rays were negative for signs of spur formation or other bone pathology

## 2020-05-22 ENCOUNTER — Ambulatory Visit: Payer: BC Managed Care – PPO | Admitting: Podiatry
